# Patient Record
Sex: Female | Born: 1990 | Race: White | Hispanic: No | Marital: Single
Health system: Southern US, Community
[De-identification: ages and names within clinical notes are randomized; demographics above are authoritative.]

---
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
## 2013-07-17 ENCOUNTER — Encounter: Payer: Self-pay | Admitting: Internal Medicine

## 2013-07-31 ENCOUNTER — Ambulatory Visit (INDEPENDENT_AMBULATORY_CARE_PROVIDER_SITE_OTHER): Payer: BC Managed Care – PPO | Admitting: Internal Medicine

## 2013-07-31 DIAGNOSIS — Z23 Encounter for immunization: Secondary | ICD-10-CM

## 2013-07-31 DIAGNOSIS — A09 Infectious gastroenteritis and colitis, unspecified: Secondary | ICD-10-CM

## 2013-07-31 MED ORDER — CIPROFLOXACIN HCL 500 MG PO TABS: 500.0000 mg | ORAL_TABLET | Freq: Two times a day (BID) | ORAL | Status: AC

## 2013-07-31 NOTE — Progress Notes (Signed)
  Cc: pre-travel to [place] Subjective:    Patient ID: Sara RammingLucy Branch, female    DOB: Dec 14, 1990, 22 y.o.   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 She has had previous travel to [place] , [place], [place], and [place]. Going to [employer] next semester. In good state of health. Up to date on childhood vaccines, had flu vaccine last year, tetanus in 2008. Hep a and hep b immune  Allergies not on file   No current outpatient prescriptions on file prior to visit.   No current facility-administered medications on file prior to visit.       Review of Systems     Objective:   Physical Exam        Assessment & Plan:  Pre travel counseling = discussed how to prevent getting traveler's diarrhea. Also mosquito repellant against chickungunya and zika virus. Gave rx for cipro in case needed for TD  Will get typhoid vaccine today

## 2013-08-13 ENCOUNTER — Encounter: Payer: Self-pay | Admitting: Internal Medicine
